# Patient Record
Sex: Female | Born: 1995 | Race: Black or African American | Hispanic: No | Marital: Single | Smoking: Never smoker
Health system: Southern US, Community
[De-identification: ages and names within clinical notes are randomized; demographics above are authoritative.]

---
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
## 2018-07-29 DIAGNOSIS — Z01419 Encounter for gynecological examination (general) (routine) without abnormal findings: Secondary | ICD-10-CM | POA: Diagnosis not present

## 2018-07-29 DIAGNOSIS — Z6824 Body mass index (BMI) 24.0-24.9, adult: Secondary | ICD-10-CM | POA: Diagnosis not present

## 2018-07-29 DIAGNOSIS — Z118 Encounter for screening for other infectious and parasitic diseases: Secondary | ICD-10-CM | POA: Diagnosis not present

## 2018-07-29 DIAGNOSIS — Z113 Encounter for screening for infections with a predominantly sexual mode of transmission: Secondary | ICD-10-CM | POA: Diagnosis not present

## 2018-08-03 DIAGNOSIS — Z113 Encounter for screening for infections with a predominantly sexual mode of transmission: Secondary | ICD-10-CM | POA: Diagnosis not present

## 2018-08-03 DIAGNOSIS — Z114 Encounter for screening for human immunodeficiency virus [HIV]: Secondary | ICD-10-CM | POA: Diagnosis not present

## 2018-08-03 DIAGNOSIS — Z1159 Encounter for screening for other viral diseases: Secondary | ICD-10-CM | POA: Diagnosis not present

## 2019-03-23 DIAGNOSIS — Z118 Encounter for screening for other infectious and parasitic diseases: Secondary | ICD-10-CM | POA: Diagnosis not present

## 2019-03-23 DIAGNOSIS — Z8619 Personal history of other infectious and parasitic diseases: Secondary | ICD-10-CM | POA: Diagnosis not present

## 2019-03-23 DIAGNOSIS — Z113 Encounter for screening for infections with a predominantly sexual mode of transmission: Secondary | ICD-10-CM | POA: Diagnosis not present

## 2019-03-23 DIAGNOSIS — Z1159 Encounter for screening for other viral diseases: Secondary | ICD-10-CM | POA: Diagnosis not present

## 2019-03-23 DIAGNOSIS — N760 Acute vaginitis: Secondary | ICD-10-CM | POA: Diagnosis not present

## 2019-03-23 DIAGNOSIS — A749 Chlamydial infection, unspecified: Secondary | ICD-10-CM | POA: Diagnosis not present

## 2019-03-23 DIAGNOSIS — Z114 Encounter for screening for human immunodeficiency virus [HIV]: Secondary | ICD-10-CM | POA: Diagnosis not present

## 2019-09-14 DIAGNOSIS — Z30016 Encounter for initial prescription of transdermal patch hormonal contraceptive device: Secondary | ICD-10-CM | POA: Diagnosis not present

## 2019-09-14 DIAGNOSIS — Z114 Encounter for screening for human immunodeficiency virus [HIV]: Secondary | ICD-10-CM | POA: Diagnosis not present

## 2019-09-14 DIAGNOSIS — Z113 Encounter for screening for infections with a predominantly sexual mode of transmission: Secondary | ICD-10-CM | POA: Diagnosis not present

## 2019-09-14 DIAGNOSIS — Z01419 Encounter for gynecological examination (general) (routine) without abnormal findings: Secondary | ICD-10-CM | POA: Diagnosis not present

## 2019-09-14 DIAGNOSIS — Z1159 Encounter for screening for other viral diseases: Secondary | ICD-10-CM | POA: Diagnosis not present

## 2019-09-14 DIAGNOSIS — B373 Candidiasis of vulva and vagina: Secondary | ICD-10-CM | POA: Diagnosis not present

## 2019-09-14 DIAGNOSIS — Z118 Encounter for screening for other infectious and parasitic diseases: Secondary | ICD-10-CM | POA: Diagnosis not present

## 2019-09-14 DIAGNOSIS — N760 Acute vaginitis: Secondary | ICD-10-CM | POA: Diagnosis not present

## 2019-09-22 DIAGNOSIS — R350 Frequency of micturition: Secondary | ICD-10-CM | POA: Diagnosis not present

## 2020-03-05 ENCOUNTER — Other Ambulatory Visit: Payer: Self-pay

## 2024-02-21 DIAGNOSIS — Z124 Encounter for screening for malignant neoplasm of cervix: Secondary | ICD-10-CM | POA: Diagnosis not present

## 2024-02-21 DIAGNOSIS — F1210 Cannabis abuse, uncomplicated: Secondary | ICD-10-CM | POA: Diagnosis not present

## 2024-02-21 DIAGNOSIS — Z309 Encounter for contraceptive management, unspecified: Secondary | ICD-10-CM | POA: Diagnosis not present

## 2024-02-21 DIAGNOSIS — Z113 Encounter for screening for infections with a predominantly sexual mode of transmission: Secondary | ICD-10-CM | POA: Diagnosis not present

## 2024-02-21 DIAGNOSIS — Z01419 Encounter for gynecological examination (general) (routine) without abnormal findings: Secondary | ICD-10-CM | POA: Diagnosis not present
